# Patient Record
Sex: Male | Born: 1980
Health system: Southern US, Community
[De-identification: ages and names within clinical notes are randomized; demographics above are authoritative.]

## PROBLEM LIST (undated history)

## (undated) DIAGNOSIS — I10 Essential (primary) hypertension: Secondary | ICD-10-CM

---
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
## 2001-06-11 ENCOUNTER — Emergency Department (HOSPITAL_COMMUNITY): Admission: EM | Admit: 2001-06-11 | Discharge: 2001-06-11 | Payer: Self-pay | Admitting: Emergency Medicine

## 2001-07-05 ENCOUNTER — Emergency Department (HOSPITAL_COMMUNITY): Admission: EM | Admit: 2001-07-05 | Discharge: 2001-07-05 | Payer: Self-pay

## 2003-12-28 ENCOUNTER — Emergency Department (HOSPITAL_COMMUNITY): Admission: EM | Admit: 2003-12-28 | Discharge: 2003-12-28 | Payer: Self-pay | Admitting: Emergency Medicine

## 2004-03-01 ENCOUNTER — Emergency Department (HOSPITAL_COMMUNITY): Admission: EM | Admit: 2004-03-01 | Discharge: 2004-03-01 | Payer: Self-pay | Admitting: Emergency Medicine

## 2004-10-05 ENCOUNTER — Emergency Department (HOSPITAL_COMMUNITY): Admission: EM | Admit: 2004-10-05 | Discharge: 2004-10-05 | Payer: Self-pay | Admitting: Emergency Medicine
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
## 2008-11-06 ENCOUNTER — Emergency Department (HOSPITAL_COMMUNITY): Admission: EM | Admit: 2008-11-06 | Discharge: 2008-11-06 | Payer: Self-pay | Admitting: Emergency Medicine

## 2009-02-01 ENCOUNTER — Emergency Department (HOSPITAL_COMMUNITY): Admission: EM | Admit: 2009-02-01 | Discharge: 2009-02-02 | Payer: Self-pay | Admitting: Emergency Medicine

## 2013-08-10 ENCOUNTER — Emergency Department (INDEPENDENT_AMBULATORY_CARE_PROVIDER_SITE_OTHER)
Admission: EM | Admit: 2013-08-10 | Discharge: 2013-08-10 | Disposition: A | Discharge status: Home / Self Care | Payer: Self-pay | Source: Home / Self Care | Attending: Family Medicine | Admitting: Family Medicine

## 2013-08-10 ENCOUNTER — Encounter (HOSPITAL_COMMUNITY): Payer: Self-pay | Admitting: Emergency Medicine

## 2013-08-10 DIAGNOSIS — L84 Corns and callosities: Secondary | ICD-10-CM

## 2013-08-10 NOTE — ED Provider Notes (Signed)
CSN: [id number]     Arrival date & time 08/10/13  [id number] History   First MD Initiated Contact with Patient 08/10/13 1006     Chief Complaint  Patient presents with  . Foot Pain   (Consider location/radiation/quality/duration/timing/severity/associated sxs/prior Treatment) Patient is a 33 y.o. male presenting with lower extremity pain. The history is provided by the patient.  Foot Pain This is a new problem. The current episode started more than 1 week ago (tender area on foot for 1 mo.). The problem has been gradually worsening.    History reviewed. No pertinent past medical history. History reviewed. No pertinent past surgical history. History reviewed. No pertinent family history. History  Substance Use Topics  . Smoking status: Current Every Day Smoker  . Smokeless tobacco: Not on file  . Alcohol Use: Yes    Review of Systems  Constitutional: Negative.   Skin: Positive for wound.    Allergies  Review of patient's allergies indicates no known allergies.  Home Medications  No current outpatient prescriptions on file. BP 140/102  Pulse 90  Temp(Src) 97.6 F (36.4 C) (Oral)  Resp 18  SpO2 98% Physical Exam  Nursing note and vitals reviewed. Constitutional: He is oriented to person, place, and time. He appears well-developed and well-nourished.  Musculoskeletal: He exhibits tenderness.       Feet:  Neurological: He is alert and oriented to person, place, and time.  Skin: Skin is warm and dry.    ED Course  Debridement Date/Time: 08/10/2013 10:49 AM Performed by: Linna HoffKINDL, Jazzmon Prindle D Authorized by: Bradd CanaryKINDL, Corbyn Steedman D Consent: Verbal consent obtained. Consent given by: patient Patient identity confirmed: arm band Local anesthesia used: no Patient sedated: no Patient tolerance: Patient tolerated the procedure well with no immediate complications. Comments: Callous debrided to core.   (including critical care time) Labs Review Labs Reviewed - No data to display Imaging  Review No results found.  EKG Interpretation    Date/Time:    Ventricular Rate:    PR Interval:    QRS Duration:   QT Interval:    QTC Calculation:   R Axis:     Text Interpretation:              MDM      Linna HoffJames D Hayze Gazda, MD 08/10/13 1052

## 2013-08-10 NOTE — Discharge Instructions (Signed)
Use corn pad as needed

## 2013-08-10 NOTE — ED Notes (Signed)
Pt  Reports  Pain  l  4 th  Toe      X  1  Month  Getting  Worse   denys  specefic     Injury         No  Bleeding no  Drainage         No known injury  -  Pt  stes  Is a  Student  Who  Does  Not stand  On his  Feet  continously

## 2013-09-02 ENCOUNTER — Ambulatory Visit: Payer: Self-pay | Attending: Internal Medicine

## 2013-10-09 ENCOUNTER — Ambulatory Visit: Payer: No Typology Code available for payment source | Attending: Internal Medicine | Admitting: Internal Medicine

## 2013-10-09 ENCOUNTER — Encounter (HOSPITAL_COMMUNITY): Payer: Self-pay | Admitting: Emergency Medicine

## 2013-10-09 ENCOUNTER — Emergency Department (INDEPENDENT_AMBULATORY_CARE_PROVIDER_SITE_OTHER)
Admission: EM | Admit: 2013-10-09 | Discharge: 2013-10-09 | Disposition: A | Discharge status: Home / Self Care | Payer: No Typology Code available for payment source | Source: Home / Self Care | Attending: Family Medicine | Admitting: Family Medicine

## 2013-10-09 VITALS — BP 147/97 | HR 92 | Temp 99.0°F | Resp 17 | Wt 254.0 lb

## 2013-10-09 DIAGNOSIS — I10 Essential (primary) hypertension: Secondary | ICD-10-CM

## 2013-10-09 DIAGNOSIS — L84 Corns and callosities: Secondary | ICD-10-CM

## 2013-10-09 DIAGNOSIS — Z0000 Encounter for general adult medical examination without abnormal findings: Secondary | ICD-10-CM

## 2013-10-09 HISTORY — DX: Essential (primary) hypertension: I10

## 2013-10-09 LAB — COMPLETE METABOLIC PANEL WITH GFR
ALT: 24 U/L (ref 0–53)
AST: 22 U/L (ref 0–37)
Albumin: 4.5 g/dL (ref 3.5–5.2)
Alkaline Phosphatase: 48 U/L (ref 39–117)
BUN: 12 mg/dL (ref 6–23)
CO2: 28 mEq/L (ref 19–32)
Calcium: 9.7 mg/dL (ref 8.4–10.5)
Chloride: 103 mEq/L (ref 96–112)
Creat: 1.12 mg/dL (ref 0.50–1.35)
GFR, Est African American: >89 mL/min
GFR, Est Non African American: 86 mL/min
Glucose, Bld: 92 mg/dL (ref 70–99)
Potassium: 4.7 mEq/L (ref 3.5–5.3)
Sodium: 140 mEq/L (ref 135–145)
Total Bilirubin: 0.3 mg/dL (ref 0.2–1.2)
Total Protein: 7.3 g/dL (ref 6.0–8.3)

## 2013-10-09 LAB — CBC
HCT: 48.2 % (ref 39.0–52.0)
Hemoglobin: 15.9 g/dL (ref 13.0–17.0)
MCH: 30.2 pg (ref 26.0–34.0)
MCHC: 33 g/dL (ref 30.0–36.0)
MCV: 91.5 fL (ref 78.0–100.0)
Platelets: 235 10*3/uL (ref 150–400)
RBC: 5.27 MIL/uL (ref 4.22–5.81)
RDW: 14.4 % (ref 11.5–15.5)
WBC: 6 10*3/uL (ref 4.0–10.5)

## 2013-10-09 LAB — TSH: TSH: 0.627 u[IU]/mL (ref 0.350–4.500)

## 2013-10-09 LAB — LIPID PANEL
CHOLESTEROL: 189 mg/dL (ref 0–200)
HDL: 49 mg/dL (ref >39)
LDL Cholesterol: 123 mg/dL — ABNORMAL HIGH (ref 0–99)
TRIGLYCERIDES: 86 mg/dL (ref <150)
Total CHOL/HDL Ratio: 3.9 Ratio
VLDL: 17 mg/dL (ref 0–40)

## 2013-10-09 MED ORDER — AMLODIPINE BESYLATE 5 MG PO TABS: 5.0000 mg | ORAL_TABLET | Freq: Every day | ORAL | Status: DC

## 2013-10-09 NOTE — Discharge Instructions (Signed)
Recommend treatment with Dr Margart SicklesScholl's corn removal disc and pads. Soak feet prior to starting therapy and every time you change the pad. When corn successfully removed always where pad in that area to prevent recurrence.   Corns and Calluses A thickening of the skin layer (usually over bony areas, such as toe joints) is known as a corn. Two types of corns exist: hard corns and soft corns. Calluses are painless areas of skin thickening that are caused by repeated pressure or irritation. Corns tend to affect toe joints and the skin between the toes; whereas, a callus can appear on any part of the body (especially the hands, feet, or knees).  SYMPTOMS   Corn:  Presence of a small (1/8 to 3/8 inch [3 to 10 mm in diameter]), painful bump on the side or over the joint of a toe.  Hard corns are more common on the outer portion of the little (fifth) toe at the joint.  Soft corns are more common between bony bumps (prominences), usually between the fourth and fifth toes or between the second and third toes.  Callus:  A rough, thickened area of skin that appears after repeated pressure or irritation. CAUSES  The purpose of corns and calluses is to protect an area of skin from injury caused by repeated irritation (rubbing or squeezing). The presence of pressure causes the skin cells to grow at a faster rate than the cells of unaffected areas. This leads to an overgrowth (corn or callus). As apposed to hard corns, soft corns tend to develop between toes, because there is more moisture. Soft corns are often the result of prolonged shoe wear, which leads to increased perspiration and moisture.  RISK INCREASES WITH:  Shoes that are too tight.  Occupations or sports that involve repetitive pressure on the hands (racquetball and baseball) or sudden stops on hard surfaces (track and tennis).  Sports that require the athlete to wear shoes, perspire, or wear clothing or protective gear that causes the production  of heat and friction. PREVENTION  Properly fitted shoes and equipment.  Modify activities to prevent constant pressure on specific areas of skin.  If possible, wear padding over areas of skin that are exposed to repeated pressure or irritation.  Keep the area between the toes dry (with powder or by removing shoes often).  Relieve shoe pressure by stretching the areas of the shoe that cause the pressure and or use ointments to soften leather shoes. PROGNOSIS  Corns and calluses typically subside if the activity that causes them is eliminated. Recovery may take up to 3 weeks. Recurrence is likely even with treatment if the cause is not removed.  RELATED COMPLICATIONS  If one overcompensates in an attempt to avoid pain, he or she may experience pain in other areas due to the changes in body movements (mechanics). TREATMENT  The best way to treat corns and calluses is to remove the source of pressure. Corn and callus pads may be helpful in reducing pressure on the affected skin. For soft corns, try to keep the affected area dry. If you cannot find shoes that fit properly, a shoe repair shop may be able to alter your shoes to reduce pressure. Occasionally a cushion for the bottom of the foot (metatarsal bar) worn within the shoe may relieve pressure on corns or calluses of the foot. For calluses, you may be able to peel or rub the thickened area with a pumice stone, sandstone, callus file, or with sandpaper to remove the  callus; wetting the affected area may make this process more effective. Do not cut the corn or callus with a razor or knife. If the corn or callus must be removed, then a medically trained person should perform the procedure. After peeling away the upper layers of a corn once or twice a day, it may be recommended to apply a non-prescription 5% to 10% salicylic ointment and cover the area with a bandage. It very uncommon to have the bony bumps (at toe joints) surgically  removed. MEDICATION   If pain medication is necessary, nonsteroidal anti-inflammatory medications, such as aspirin and ibuprofen, or other minor pain relievers, such as acetaminophen, are often recommended. Contact your caregiver immediately if any bleeding, stomach upset, or signs of an allergic reaction occur.  Topical salicylic ointments (5% to 10%) may be of benefit.  Prescription pain medications may be given by a caregiver. Use only as directed and only as much as you need.  Soak the foot for 20 minutes, twice a day, in a gallon of warm water. This may help to soften corns and calluses. Care should be taken to thoroughly dry the foot, especially between the toes, after soaking. SEEK MEDICAL CARE IF:   Symptoms get worse or do not improve in 2 weeks despite treatment.  Any signs of infection develop, including redness, swelling, increased pain or tenderness, or increased warmth around the corn or callus.  New, unexplained symptoms develop (drugs used in treatment may produce side effects). Document Released: 07/22/2005 Document Revised: 10/14/2011 Document Reviewed: 11/03/2008 [facility] Patient Information 2014 [place], [place].

## 2013-10-09 NOTE — Progress Notes (Unsigned)
Patient ID: Adrian Schwartz, male   DOB: 03/11/1981, 33 y.o.   MRN: [id number]   HPI: Adrian Springmory L Walz is a 33 y.o. male presenting on 10/09/2013 here for establishing care and a callus on his left foot which he had shaved at urgent care and has recurred. It is very painful.     past medical history - Colon cancer age [age] in father  No past surgical history on file.  Current Outpatient Prescriptions  Medication Sig Dispense Refill   No current facility-administered medications for this visit.    No Known Allergies  No family history on file.  History   Social History  . Marital Status: Single    Spouse Name: N/A    Number of Children: N/A  . Years of Education: N/A   Occupational History  . Not on file.   Social History Main Topics  . Smoking status: Current Every Day Smoker- 1 ppd  . Smokeless tobacco: Not on file  . Alcohol Use: Yes  . Drug Use: Not on file  . Sexual Activity: Not on file   Other Topics Concern  . Not on file   Social History Narrative  . No narrative on file    Review of Systems  Review of Systems  Constitutional: Negative for fever, chills, diaphoresis, activity change, appetite change and fatigue.  HENT: Negative for ear pain, nosebleeds, congestion, facial swelling, rhinorrhea, neck pain, neck stiffness and ear discharge.  Eyes: Negative for pain, discharge, redness, itching and visual disturbance.  Respiratory: Negative for cough, choking, chest tightness, shortness of breath, wheezing and stridor.  Cardiovascular: Negative for chest pain, palpitations and leg swelling.  Gastrointestinal: Negative for abdominal distention, vomiting, diarrhea or consitpation Genitourinary: Negative for dysuria, urgency, frequency, hematuria, flank pain, decreased urine volume, difficulty urinating and dyspareunia.  Musculoskeletal: Negative for back pain, joint swelling, arthralgias or gait problem.  Neurological: Negative for dizziness, tremors, seizures,  syncope, facial asymmetry, speech difficulty, weakness, light-headedness, numbness and headaches.  Hematological: Negative for adenopathy. Does not bruise/bleed easily.  Psychiatric/Behavioral: Negative for hallucinations, behavioral problems, confusion, dysphoric mood   Objective:  BP 147/97  Pulse 92  Temp(Src) 99 F (37.2 C)  Resp 17  Wt 254 lb (115.214 kg)  SpO2 100% Filed Weights   10/09/13 1217  Weight: 254 lb (115.214 kg)     Physical Exam  Constitutional: Appears well-developed and well-nourished. No distress. HENT: Normocephalic. External right and left ear normal. Oropharynx is clear and moist.  Eyes: Conjunctivae and EOM are normal. PERRLA, no scleral icterus.  Neck: Normal ROM. Neck supple. No JVD. No tracheal deviation. No thyromegaly.  CVS: RRR, S1/S2 +, no murmurs, no gallops, no carotid bruit.  Pulmonary: Effort and breath sounds normal, no stridor, rhonchi, wheezes, rales.  Abdominal: Soft. BS +,  no distension, tenderness, rebound or guarding.  Musculoskeletal: Normal range of motion. No edema and no tenderness.  Neuro: Alert. Normal reflexes, muscle tone coordination. No cranial nerve deficit. Skin: Skin is warm and dry. Callus on sole of left foot near third toe noted. Not diaphoretic. No erythema. No pallor.  Psychiatric: Normal mood and affect. Behavior, judgment, thought content normal.   No results found for this basename: WBC, HGB, HCT, MCV, PLT   No results found for this basename: CREATININE, BUN, NA, K, CL, CO2    No results found for this basename: HGBA1C   Lipid Panel  No results found for this basename: chol, trig, hdl, cholhdl, vldl, ldlcalc  There are no active problems to display for this patient.    Preventative Medicine:  No health maintenance topics applied.  No health maintenance topics applied.   LAB WORK: ordered Metabolic panel: CBC:  Vitamin D : Lipid Panel: TSH:     Assessment and plan: Routine history  and physical examination of adult - Plan: CBC, COMPLETE METABOLIC PANEL WITH GFR, TSH, Lipid panel, Vit D  25 hydroxy (rtn osteoporosis monitoring)  HTN (hypertension) -  Amlodipine 5 mg dialy  Pre-ulcerative corn or callous  - return to urgent care - pressure reliving issues discussed  Morbid obesity  - used to work out but cannot due to corn - very concerned about his weight   Return in about 3 months (around 01/09/2014).   The patient was given clear instructions to go to ER or return to medical center if symptoms don't improve, worsen or new problems develop. The patient verbalized understanding. The patient was told to call to get lab results if they haven't heard anything in the next week.     Calvert Cantor, MD

## 2013-10-09 NOTE — Progress Notes (Unsigned)
Patient here to establish care Has a callus to his left foot  Uses a cream for his eczema but does not know the name of the cream

## 2013-10-09 NOTE — ED Notes (Signed)
Patient reports recurrent foot pain secondary to a callus.  Seen in January for the same.  Was seen at [facility] this am and mentioned callus to physician there and instructed to come to [facility] for this complaint

## 2013-10-10 NOTE — ED Provider Notes (Signed)
CSN: [id number]     Arrival date & time 10/09/13  1257 History   First MD Initiated Contact with Patient 10/09/13 1513     Chief Complaint  Patient presents with  . Foot Pain    Patient is a 33 y.o. male presenting with lower extremity pain. The history is provided by the patient.  Foot Pain This is a recurrent problem. The current episode started more than 1 week ago. The problem occurs constantly. The problem has been gradually worsening. The symptoms are aggravated by walking and standing. Nothing relieves the symptoms. He has tried nothing for the symptoms.  Pt reports painful corn/callus to the bottom of his foot under his (L) 4th toe. States he was here in January and had the same callus debrided but it has returned. Pt reports it is painful to walk on. Pt states he does not want this debrided he wants another way to get rid of the callus.   Past Medical History  Diagnosis Date  . Hypertension    History reviewed. No pertinent past surgical history. No family history on file. History  Substance Use Topics  . Smoking status: Current Every Day Smoker  . Smokeless tobacco: Not on file  . Alcohol Use: Yes    Review of Systems  All other systems reviewed and are negative.    Allergies  Review of patient's allergies indicates no known allergies.  Home Medications   Current Outpatient Rx  Name  Route  Sig  Dispense  Refill  . amLODipine (NORVASC) 5 MG tablet   Oral   Take 1 tablet (5 mg total) by mouth daily.   90 tablet   3    BP 157/89  Pulse 90  Temp(Src) 98.4 F (36.9 C) (Oral)  Resp 18  SpO2 97% Physical Exam  Nursing note and vitals reviewed. Constitutional: He is oriented to person, place, and time. He appears well-developed and well-nourished.  HENT:  Head: Normocephalic and atraumatic.  Eyes: Conjunctivae are normal.  Cardiovascular: Normal rate.   Pulmonary/Chest: Effort normal.  Musculoskeletal:       Feet:  Neurological: He is alert and oriented  to person, place, and time.  Skin: Skin is warm and dry.  Psychiatric: He has a normal mood and affect.    ED Course  Procedures (including critical care time) Labs Review Labs Reviewed - No data to display Imaging Review No results found.   MDM   1. Corn of foot    At length discussion regarding treatment of corn/callus w/ Dr Margart SicklesScholl's medicated pads along with soaking feet etc. Discussed ways to avoid getting these again. Pt verbalizes understanding and agrees he would prefer to treat at home.     Leanne ChangKatherine P Shavonte Zhao, NP 10/10/13 2055

## 2013-10-11 LAB — VITAMIN D 25 HYDROXY (VIT D DEFICIENCY, FRACTURES): Vit D, 25-Hydroxy: 24 ng/mL — ABNORMAL LOW (ref 30–89)

## 2013-10-11 NOTE — ED Provider Notes (Signed)
Medical screening examination/treatment/procedure(s) were performed by resident physician or non-physician practitioner and as supervising physician I was immediately available for consultation/collaboration.   Zuzu Befort DOUGLAS MD.   Calais Svehla D Lieutenant Abarca, MD 10/11/13 2054 

## 2013-10-18 ENCOUNTER — Telehealth: Payer: Self-pay | Admitting: Internal Medicine

## 2013-10-18 NOTE — Telephone Encounter (Signed)
Pt has come in today to get the results of his bloodwork; please f/u with patient at [phone]

## 2013-10-20 NOTE — Telephone Encounter (Signed)
Left message on voicemail Patient last labs was 10/09/13

## 2013-10-21 MED ORDER — VITAMIN D (ERGOCALCIFEROL) 1.25 MG (50000 UNIT) PO CAPS: 50000.0000 [IU] | ORAL_CAPSULE | ORAL | Status: AC

## 2013-10-21 NOTE — Telephone Encounter (Signed)
Attempted to reach pt;nonworking number listed Script Vitamin D 50,000 units ordered with 4 refills

## 2013-10-21 NOTE — Progress Notes (Signed)
Quick Note:  Please let patient know that they are deficient in Vit D and prescribe 50,000 U Vit D weekly for 4 wks x 4 refills.  If they are unable to afford it, they can take Vit D2 OTC 2000 U daily. ______ 

## 2013-10-21 NOTE — Telephone Encounter (Signed)
Message copied by Darlis LoanSMITH, Tieasha Larsen D on Thu Oct 21, 2013  1:38 PM ------      Message from: Calvert CantorIZWAN, SAIMA      Created: Thu Oct 21, 2013  9:48 AM       Please let patient know that they are deficient in Vit D and prescribe 50,000 U Vit D weekly for 4 wks x 4 refills.        If they are unable to afford it, they can take Vit D2 OTC 2000 U daily. ------

## 2013-12-16 ENCOUNTER — Ambulatory Visit: Payer: No Typology Code available for payment source | Admitting: *Deleted

## 2013-12-16 NOTE — Progress Notes (Unsigned)
Patient reports having a callus on bottom of left and right foot. Patient has been seen at urgent care twice for callus on left foot. Patient reports he has been using Dr. Jari SportsmanScholls as advised by urgent but it is not working. Patient informed he will need a Podiatry referral. Patient given contact information for Dr. Daleen SquibbJames Crawford. Patient also reports a groin problem. Dr. Hyman HopesJegede did a visual exam. Patient informed he has skin tag that is getting bigger. Patient informed he will be referred to Dermatologist. Patient provided contact information to [facility] Derm[facility].  Patient verbalized understanding. Reather LaurenceJamie R Charels Stambaugh, RN

## 2017-12-24 ENCOUNTER — Encounter: Payer: Self-pay | Admitting: Podiatry

## 2017-12-30 NOTE — Progress Notes (Signed)
This encounter was created in error - please disregard.

## 2018-01-05 MED FILL — HYDROCODON-APAP 5-325: 5-325 | 3 days supply | Qty: 18 | Fill #0

## 2018-05-25 ENCOUNTER — Ambulatory Visit (HOSPITAL_COMMUNITY)
Admission: EM | Admit: 2018-05-25 | Discharge: 2018-05-25 | Disposition: A | Discharge status: Home / Self Care | Payer: Self-pay | Attending: Family Medicine | Admitting: Family Medicine

## 2018-05-25 ENCOUNTER — Encounter (HOSPITAL_COMMUNITY): Payer: Self-pay | Admitting: Emergency Medicine

## 2018-05-25 DIAGNOSIS — R202 Paresthesia of skin: Secondary | ICD-10-CM

## 2018-05-25 DIAGNOSIS — R200 Anesthesia of skin: Secondary | ICD-10-CM

## 2018-05-25 MED ORDER — METHYLPREDNISOLONE 4 MG PO TBPK: ORAL_TABLET | ORAL | 0 refills | Status: DC

## 2018-05-25 NOTE — Discharge Instructions (Signed)
Take the medrol pak as instructed Take all of day one today After the steroids take ibuprofen 800 mg 3 x a day with food as needed See a neurologist if symptoms persist or worsen

## 2018-05-25 NOTE — ED Triage Notes (Signed)
Pt c/o R hand numbness x1 week off and on, thought he was sleeping on his arm the wrong way but he slept on the other side and still c/o R arm numbness.

## 2018-05-25 NOTE — ED Provider Notes (Signed)
[facility]    CSN: [id number] Arrival date & time: 05/25/18  1005     History   Chief Complaint Chief Complaint  Patient presents with  . Arm Pain    HPI Adrian Schwartz is a 37 y.o. male.   HPI  Patient states he woke up with a numb hand a week ago.  He thinks because he was "sleeping wrong" on his right arm.  The numbness is more in his thumb index and long fingers.  He does not do repetitive motion at work.  His work is heavy.  He is never had any neuropathy or nerve pain.  He states he does drink alcohol.  Sometimes heavily.  He also sometimes takes over-the-counter sleep aids.  His strength is been normal.  Coordination is normal.  The numbness appears to wax and wane.  Is present during the day, as well as upon awakening each morning.  It does not feel like it is getting better.  No trauma.  No overuse.  Past Medical History:  Diagnosis Date  . Hypertension     There are no active problems to display for this patient.   History reviewed. No pertinent surgical history.     Home Medications    Prior to Admission medications   Medication Sig Start Date End Date Taking? Authorizing Provider  methylPREDNISolone (MEDROL DOSEPAK) 4 MG TBPK tablet tad 05/25/18   Eustace Moore, MD  Vitamin D, Ergocalciferol, (DRISDOL) 50000 UNITS CAPS capsule Take 1 capsule (50,000 Units total) by mouth every 7 (seven) days. 10/21/13   Calvert Cantor, MD    Family History No family history on file.  Social History Social History   Tobacco Use  . Smoking status: Current Every Day Smoker  Substance Use Topics  . Alcohol use: Yes  . Drug use: No     Allergies   Patient has no known allergies.   Review of Systems Review of Systems  Constitutional: Negative for chills and fever.  HENT: Negative for ear pain and sore throat.   Eyes: Negative for pain and visual disturbance.  Respiratory: Negative for cough and shortness of breath.   Cardiovascular: Negative  for chest pain and palpitations.  Gastrointestinal: Negative for abdominal pain and vomiting.  Genitourinary: Negative for dysuria and hematuria.  Musculoskeletal: Negative for arthralgias and back pain.  Skin: Negative for color change and rash.  Neurological: Positive for numbness. Negative for seizures and syncope.  All other systems reviewed and are negative.    Physical Exam Triage Vital Signs ED Triage Vitals  Enc Vitals Group     BP 05/25/18 1053 (!) 148/63     Pulse Rate 05/25/18 1053 69     Resp 05/25/18 1053 18     Temp 05/25/18 1053 98.1 F (36.7 C)     Temp src --      SpO2 05/25/18 1053 100 %     Weight --      Height --      Head Circumference --      Peak Flow --      Pain Score 05/25/18 1055 0     Pain Loc --      Pain Edu? --      Excl. in GC? --    No data found.  Updated Vital Signs BP (!) 148/63   Pulse 69   Temp 98.1 F (36.7 C)   Resp 18   SpO2 100%   Visual Acuity Right Eye Distance:  Left Eye Distance:   Bilateral Distance:    Right Eye Near:   Left Eye Near:    Bilateral Near:     Physical Exam  Constitutional: He appears well-developed and well-nourished. No distress.  HENT:  Head: Normocephalic and atraumatic.  Mouth/Throat: Oropharynx is clear and moist.  Eyes: Pupils are equal, round, and reactive to light. Conjunctivae are normal.  Neck: Normal range of motion.  Cardiovascular: Normal rate.  Pulmonary/Chest: Effort normal. No respiratory distress.  Abdominal: Soft. He exhibits no distension.  Musculoskeletal: Normal range of motion. He exhibits no edema, tenderness or deformity.  Neurological: He is alert. He displays normal reflexes. He exhibits normal muscle tone. Coordination normal.  Decreased sensation to light touch over index long and thumb.  Good range of motion.  Negative Phalen's and Tinel's.  Good range of motion of neck.  Negative Spurling's.  Skin: Skin is warm and dry.  Psychiatric: He has a normal mood and  affect. His behavior is normal.     UC Treatments / Results  Labs (all labs ordered are listed, but only abnormal results are displayed) Labs Reviewed - No data to display  EKG None  Radiology No results found.  Procedures Procedures (including critical care time)  Medications Ordered in UC Medications - No data to display  Initial Impression / Assessment and Plan / UC Course  I have reviewed the triage vital signs and the nursing notes.  Pertinent labs & imaging results that were available during my care of the patient were reviewed by me and considered in my medical decision making (see chart for details).     History is most consistent with carpal tunnel syndrome.  I cannot reproduce his discomfort with provocative testing.  We discussed other causes of neuropathy such as cervical, or polyneuropathy.  I discussed with him that sometimes you can be asleep, especially if sedated, and get a radial nerve palsy.  He does not have any weakness in his wrist extensors.  I am going to treat him with anti-inflammatories, prednisone pack.  When this is done he can take ibuprofen.  If his numbness persists he will need to see a neurologist. Final Clinical Impressions(s) / UC Diagnoses   Final diagnoses:  Numbness and tingling of right arm     Discharge Instructions     Take the medrol pak as instructed Take all of day one today After the steroids take ibuprofen 800 mg 3 x a day with food as needed See a neurologist if symptoms persist or worsen   ED Prescriptions    Medication Sig Dispense Auth. Provider   methylPREDNISolone (MEDROL DOSEPAK) 4 MG TBPK tablet tad 21 tablet Eustace Moore, MD     Controlled Substance Prescriptions [place] Controlled Substance Registry consulted? Not Applicable   Eustace Moore, MD 05/25/18 2039

## 2019-08-31 ENCOUNTER — Encounter (HOSPITAL_COMMUNITY): Payer: Self-pay | Admitting: Emergency Medicine

## 2019-08-31 ENCOUNTER — Other Ambulatory Visit: Payer: Self-pay

## 2019-08-31 ENCOUNTER — Ambulatory Visit (HOSPITAL_COMMUNITY)
Admission: EM | Admit: 2019-08-31 | Discharge: 2019-08-31 | Disposition: A | Discharge status: Home / Self Care | Payer: Self-pay | Attending: Physician Assistant | Admitting: Physician Assistant

## 2019-08-31 DIAGNOSIS — Y93B9 Activity, other involving muscle strengthening exercises: Secondary | ICD-10-CM

## 2019-08-31 DIAGNOSIS — S39012A Strain of muscle, fascia and tendon of lower back, initial encounter: Secondary | ICD-10-CM

## 2019-08-31 MED ORDER — CYCLOBENZAPRINE HCL 10 MG PO TABS: 10.0000 mg | ORAL_TABLET | Freq: Three times a day (TID) | ORAL | 0 refills | Status: DC | PRN

## 2019-08-31 NOTE — ED Triage Notes (Signed)
Pt here with lower back pain x 3 weeks after injuring at gym

## 2019-08-31 NOTE — ED Provider Notes (Signed)
[facility]    CSN: [id number] Arrival date & time: 08/31/19  6387      History   Chief Complaint Chief Complaint  Patient presents with  . Back Pain    HPI Adrian Schwartz is a 39 y.o. male.   Patient reports to urgent care today for Lower back pain after working out 3 weeks ago. He reports he was using a squat machine and noticed lower back pain after one of his sets. He did not notice a pop or a sudden pain. The pain started midline but has since become on the side of his spine. He reports the pain is mostly on the left side. He denies radiation of this pain down his legs. Denies numbness, weakness or tingling. No loss of bowel or bladder control. He has tried over the counter medication and had a professional massage, these helped some. He has rested more recently.   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      Past Medical History:  Diagnosis Date  . Hypertension     There are no problems to display for this patient.   History reviewed. No pertinent surgical history.     Home Medications    Prior to Admission medications   Medication Sig Start Date End Date Taking? Authorizing Provider  cyclobenzaprine (FLEXERIL) 10 MG tablet Take 1 tablet (10 mg total) by mouth 3 (three) times daily as needed for muscle spasms. 08/31/19   Orvin Netter, Marguerita Beards, PA-C  methylPREDNISolone (MEDROL DOSEPAK) 4 MG TBPK tablet tad Patient not taking: Reported on 08/31/2019 05/25/18   Raylene Everts, MD  Vitamin D, Ergocalciferol, (DRISDOL) 50000 UNITS CAPS capsule Take 1 capsule (50,000 Units total) by mouth every 7 (seven) days. 10/21/13   Debbe Odea, MD    Family History Family History  Problem Relation Age of Onset  . Hypertension Mother   . Cancer Father     Social History Social History   Tobacco Use  . Smoking status: Current Every Day Smoker  . Smokeless tobacco: Never Used  Substance Use Topics  . Alcohol use: Yes  . Drug use: No      Allergies   Patient has no known allergies.   Review of Systems Review of Systems  Constitutional: Negative for chills and fever.  Musculoskeletal: Positive for back pain. Negative for arthralgias, gait problem, joint swelling, myalgias, neck pain and neck stiffness.  Skin: Negative for color change and rash.  Neurological: Negative for weakness and numbness.     Physical Exam Triage Vital Signs ED Triage Vitals  Enc Vitals Group     BP 08/31/19 1021 (!) 146/97     Pulse Rate 08/31/19 1021 68     Resp 08/31/19 1021 18     Temp 08/31/19 1021 98.3 F (36.8 C)     Temp Source 08/31/19 1021 Oral     SpO2 08/31/19 1021 100 %     Weight --      Height --      Head Circumference --      Peak Flow --      Pain Score 08/31/19 1022 6     Pain Loc --      Pain Edu? --      Excl. in [place]? --    No data found.  Updated Vital Signs BP (!) 146/97 (BP Location: Right Arm)   Pulse 68   Temp 98.3 F (36.8 C) (Oral)  Resp 18   SpO2 100%   Visual Acuity Right Eye Distance:   Left Eye Distance:   Bilateral Distance:    Right Eye Near:   Left Eye Near:    Bilateral Near:     Physical Exam Vitals and nursing note reviewed.  Constitutional:      General: He is not in acute distress.    Appearance: Normal appearance. He is well-developed. He is not ill-appearing.  HENT:     Head: Normocephalic and atraumatic.  Eyes:     Conjunctiva/sclera: Conjunctivae normal.     Pupils: Pupils are equal, round, and reactive to light.  Cardiovascular:     Rate and Rhythm: Normal rate.  Pulmonary:     Effort: Pulmonary effort is normal. No respiratory distress.  Musculoskeletal:     Cervical back: Normal and neck supple.     Thoracic back: Normal.     Lumbar back: Spasms (left lumbar very tight and board like) and tenderness (left lumbar region) present. No bony tenderness. Normal range of motion. Negative right straight leg raise test and negative left straight leg raise test.      Comments: No midline tenderness  Skin:    General: Skin is warm and dry.  Neurological:     Mental Status: He is alert.      UC Treatments / Results  Labs (all labs ordered are listed, but only abnormal results are displayed) Labs Reviewed - No data to display  EKG   Radiology No results found.  Procedures Procedures (including critical care time)  Medications Ordered in UC Medications - No data to display  Initial Impression / Assessment and Plan / UC Course  I have reviewed the triage vital signs and the nursing notes.  Pertinent labs & imaging results that were available during my care of the patient were reviewed by me and considered in my medical decision making (see chart for details).     #Lumbar strain - no radiculopathy, no midline tenderness. SLR negative. Given history, believe to be a strain. Flexeril given, exercises given and encouraged movement and continued exercise as tolerated. Ibuprofen for pain   Final Clinical Impressions(s) / UC Diagnoses   Final diagnoses:  Strain of lumbar region, initial encounter     Discharge Instructions     Take 2 regular strength ibuprofen every 6 hours as needed for pain  Do not drive or drink alcohol with the flexeril  Use heat or ice as preferred  If you are not improving or have shooting pain down your legs or weakness , return for re-evaluation.  Use the stretches and exercises attached and continue to move.      ED Prescriptions    Medication Sig Dispense Auth. Provider   cyclobenzaprine (FLEXERIL) 10 MG tablet Take 1 tablet (10 mg total) by mouth 3 (three) times daily as needed for muscle spasms. 20 tablet Joan Herschberger, Veryl Speak, PA-C     PDMP not reviewed this encounter.   Hermelinda Medicus, PA-C 08/31/19 1203

## 2019-08-31 NOTE — Discharge Instructions (Addendum)
Take 2 regular strength ibuprofen every 6 hours as needed for pain  Do not drive or drink alcohol with the flexeril  Use heat or ice as preferred  If you are not improving or have shooting pain down your legs or weakness , return for re-evaluation.  Use the stretches and exercises attached and continue to move.

## 2020-04-22 ENCOUNTER — Ambulatory Visit (HOSPITAL_COMMUNITY)
Admission: EM | Admit: 2020-04-22 | Discharge: 2020-04-22 | Disposition: A | Discharge status: Home / Self Care | Payer: Self-pay | Attending: Family Medicine | Admitting: Family Medicine

## 2020-04-22 ENCOUNTER — Encounter (HOSPITAL_COMMUNITY): Payer: Self-pay | Admitting: Emergency Medicine

## 2020-04-22 ENCOUNTER — Other Ambulatory Visit: Payer: Self-pay

## 2020-04-22 DIAGNOSIS — M79672 Pain in left foot: Secondary | ICD-10-CM

## 2020-04-22 DIAGNOSIS — L03116 Cellulitis of left lower limb: Secondary | ICD-10-CM

## 2020-04-22 MED ORDER — PREDNISONE 10 MG (21) PO TBPK: ORAL_TABLET | Freq: Every day | ORAL | 0 refills | Status: AC

## 2020-04-22 MED ORDER — SULFAMETHOXAZOLE-TRIMETHOPRIM 800-160 MG PO TABS: 1.0000 | ORAL_TABLET | Freq: Two times a day (BID) | ORAL | 0 refills | Status: AC

## 2020-04-22 NOTE — ED Triage Notes (Signed)
left foot swelling.  No known injury. Patient noticed swelling and pain for 3 days.

## 2020-04-22 NOTE — ED Provider Notes (Signed)
[facility]   [id number] 04/22/20 Arrival Time: 1250  YI:FOYDX PAIN  SUBJECTIVE: History from: patient. Adrian Schwartz is a 39 y.o. male complains of left foot swelling and pain.  Reports that this has been going on for the last 3 days..  Denies a precipitating event or specific injury. Describes the pain as constant and achy in character. Has not tried OTC medications without relief. Symptoms are made worse with activity.  Denies history of gout.  Denies similar symptoms in the past.  Denies fever, chills, ecchymosis, weakness, numbness and tingling, saddle paresthesias, loss of bowel or bladder function.      ROS: As per HPI.  All other pertinent ROS negative.     Past Medical History:  Diagnosis Date  . Hypertension    History reviewed. No pertinent surgical history. No Known Allergies No current facility-administered medications on file prior to encounter.   Current Outpatient Medications on File Prior to Encounter  Medication Sig Dispense Refill  . cyclobenzaprine (FLEXERIL) 10 MG tablet Take 1 tablet (10 mg total) by mouth 3 (three) times daily as needed for muscle spasms. 20 tablet 0  . methylPREDNISolone (MEDROL DOSEPAK) 4 MG TBPK tablet tad (Patient not taking: Reported on 08/31/2019) 21 tablet 0  . Vitamin D, Ergocalciferol, (DRISDOL) 50000 UNITS CAPS capsule Take 1 capsule (50,000 Units total) by mouth every 7 (seven) days. 4 capsule 4   Social History   Socioeconomic History  . Marital status: Single    Spouse name: Not on file  . Number of children: Not on file  . Years of education: Not on file  . Highest education level: Not on file  Occupational History  . Not on file  Tobacco Use  . Smoking status: Current Every Day Smoker  . Smokeless tobacco: Never Used  Substance and Sexual Activity  . Alcohol use: Yes  . Drug use: No  . Sexual activity: Not on file  Other Topics Concern  . Not on file  Social History Narrative  . Not on file   Social  Determinants of Health   Financial Resource Strain:   . Difficulty of Paying Living Expenses: Not on file  Food Insecurity:   . Worried About Programme researcher, broadcasting/film/video in the Last Year: Not on file  . Ran Out of Food in the Last Year: Not on file  Transportation Needs:   . Lack of Transportation (Medical): Not on file  . Lack of Transportation (Non-Medical): Not on file  Physical Activity:   . Days of Exercise per Week: Not on file  . Minutes of Exercise per Session: Not on file  Stress:   . Feeling of Stress : Not on file  Social Connections:   . Frequency of Communication with Friends and Family: Not on file  . Frequency of Social Gatherings with Friends and Family: Not on file  . Attends Religious Services: Not on file  . Active Member of Clubs or Organizations: Not on file  . Attends Banker Meetings: Not on file  . Marital Status: Not on file  Intimate Partner Violence:   . Fear of Current or Ex-Partner: Not on file  . Emotionally Abused: Not on file  . Physically Abused: Not on file  . Sexually Abused: Not on file   Family History  Problem Relation Age of Onset  . Hypertension Mother   . Cancer Father     OBJECTIVE:  Vitals:   04/22/20 1548  BP: (!) 131/96  Pulse:  73  Resp: 18  Temp: 98.3 F (36.8 C)  TempSrc: Oral  SpO2: 100%    General appearance: ALERT; in no acute distress.  Head: NCAT Lungs: Normal respiratory effort CV:  pulses 2+ bilaterally. Cap refill < 2 seconds Musculoskeletal:  Inspection: Skin warm, dry, clear and intact without obvious erythema, effusion, or ecchymosis.  Palpation: Dorsal lateral aspect of the left foot is tender to palpation ROM: Limited ROM active and passive to left toes Skin: warm and dry, erythema, swelling, tenderness to dorsal surface of left foot.  There is an area of darker skin near the dorsal lateral aspect of the left foot, exam is consistent with cellulitis Neurologic: Ambulates without difficulty;  Sensation intact about the upper/ lower extremities Psychological: alert and cooperative; normal mood and affect  DIAGNOSTIC STUDIES:  No results found.   ASSESSMENT & PLAN:  1. Cellulitis of left lower extremity   2. Foot pain, left     Meds ordered this encounter  Medications  . predniSONE (STERAPRED UNI-PAK 21 TAB) 10 MG (21) TBPK tablet    Sig: Take by mouth daily for 6 days. Take 6 tablets on day 1, 5 tablets on day 2, 4 tablets on day 3, 3 tablets on day 4, 2 tablets on day 5, 1 tablet on day 6    Dispense:  21 tablet    Refill:  0    Order Specific Question:   Supervising Provider    Answer:   Merrilee Jansky [id number]  . sulfamethoxazole-trimethoprim (BACTRIM DS) 800-160 MG tablet    Sig: Take 1 tablet by mouth 2 (two) times daily for 7 days.    Dispense:  14 tablet    Refill:  0    Order Specific Question:   Supervising Provider    Answer:   Merrilee Jansky [id number]   Prescribed steroid taper Prescribed Bactrim Take as directed and to completion Follow-up with this office or with primary care symptoms are persisting Continue conservative management of rest, ice, and gentle stretches Take ibuprofen as needed for pain relief (may cause abdominal discomfort, ulcers, and GI bleeds avoid taking with other NSAIDs) Return or go to the ER if you have any new or worsening symptoms (fever, chills, chest pain, abdominal pain, changes in bowel or bladder habits, pain radiating into lower legs)   Reviewed expectations re: course of current medical issues. Questions answered. Outlined signs and symptoms indicating need for more acute intervention. Patient verbalized understanding. After Visit Summary given.       Moshe Cipro, NP 04/22/20 1705

## 2020-04-22 NOTE — Discharge Instructions (Signed)
I have sent in Bactrim for you to take twice daily for 7 days  I have sent in a prednisone taper for you to take for 6 days. 6 tablets on day one, 5 tablets on day two, 4 tablets on day three, 3 tablets on day four, 2 tablets on day five, and 1 tablet on day six.  Follow up with this office or with primary care as needed

## 2020-04-25 ENCOUNTER — Encounter (HOSPITAL_COMMUNITY): Payer: Self-pay

## 2020-04-25 ENCOUNTER — Ambulatory Visit (HOSPITAL_COMMUNITY)
Admission: EM | Admit: 2020-04-25 | Discharge: 2020-04-25 | Disposition: A | Discharge status: Home / Self Care | Payer: Self-pay | Attending: Internal Medicine | Admitting: Internal Medicine

## 2020-04-25 ENCOUNTER — Other Ambulatory Visit: Payer: Self-pay

## 2020-04-25 DIAGNOSIS — M79672 Pain in left foot: Secondary | ICD-10-CM

## 2020-04-25 NOTE — ED Provider Notes (Signed)
[facility]    CSN: [id number] Arrival date & time: 04/25/20  1407      History   Chief Complaint Chief Complaint  Patient presents with  . Foot Pain    HPI Adrian Schwartz is a 39 y.o. male was seen here 3 days ago for left foot pain.  Patient was managed as a case of cellulitis of the left foot and also managed for acute gout flare.  Patient has no history of gout.  No trauma to the foot.  Patient comes in saying that the pain is not improved.  No fever or chills.  Swelling is persistent.  Patient is requesting further evaluation.Marland Kitchen   HPI  Past Medical History:  Diagnosis Date  . Hypertension     There are no problems to display for this patient.   No past surgical history on file.     Home Medications    Prior to Admission medications   Medication Sig Start Date End Date Taking? Authorizing Provider  cyclobenzaprine (FLEXERIL) 10 MG tablet Take 1 tablet (10 mg total) by mouth 3 (three) times daily as needed for muscle spasms. 08/31/19   Darr, Veryl Speak, PA-C  predniSONE (STERAPRED UNI-PAK 21 TAB) 10 MG (21) TBPK tablet Take by mouth daily for 6 days. Take 6 tablets on day 1, 5 tablets on day 2, 4 tablets on day 3, 3 tablets on day 4, 2 tablets on day 5, 1 tablet on day 6 04/22/20 04/28/20  Moshe Cipro, NP  sulfamethoxazole-trimethoprim (BACTRIM DS) 800-160 MG tablet Take 1 tablet by mouth 2 (two) times daily for 7 days. 04/22/20 04/29/20  Moshe Cipro, NP  Vitamin D, Ergocalciferol, (DRISDOL) 50000 UNITS CAPS capsule Take 1 capsule (50,000 Units total) by mouth every 7 (seven) days. 10/21/13   Calvert Cantor, MD    Family History Family History  Problem Relation Age of Onset  . Hypertension Mother   . Cancer Father     Social History Social History   Tobacco Use  . Smoking status: Current Every Day Smoker  . Smokeless tobacco: Never Used  Substance Use Topics  . Alcohol use: Yes  . Drug use: No     Allergies   Patient has no known  allergies.   Review of Systems Review of Systems  Gastrointestinal: Negative.   Genitourinary: Negative.   Musculoskeletal: Positive for arthralgias and myalgias. Negative for back pain and joint swelling.  Skin: Negative.      Physical Exam Triage Vital Signs ED Triage Vitals  Enc Vitals Group     BP 04/25/20 1443 127/81     Pulse Rate 04/25/20 1443 78     Resp 04/25/20 1443 16     Temp 04/25/20 1443 98.4 F (36.9 C)     Temp Source 04/25/20 1443 Oral     SpO2 04/25/20 1443 99 %     Weight --      Height --      Head Circumference --      Peak Flow --      Pain Score 04/25/20 1458 10     Pain Loc --      Pain Edu? --      Excl. in [place]? --    No data found.  Updated Vital Signs BP 127/81 (BP Location: Left Arm)   Pulse 78   Temp 98.4 F (36.9 C) (Oral)   Resp 16   SpO2 99%   Visual Acuity Right Eye Distance:   Left Eye  Distance:   Bilateral Distance:    Right Eye Near:   Left Eye Near:    Bilateral Near:     Physical Exam Vitals and nursing note reviewed.  Constitutional:      General: He is not in acute distress.    Appearance: Normal appearance. He is not ill-appearing.  HENT:     Right Ear: Tympanic membrane normal.     Left Ear: Tympanic membrane normal.  Cardiovascular:     Rate and Rhythm: Normal rate and regular rhythm.     Pulses: Normal pulses.     Heart sounds: Normal heart sounds.  Pulmonary:     Effort: Pulmonary effort is normal.     Breath sounds: Normal breath sounds.  Musculoskeletal:     Comments: Left foot swelling with mild erythema over the left great toe.  Neurological:     Mental Status: He is alert.      UC Treatments / Results  Labs (all labs ordered are listed, but only abnormal results are displayed) Labs Reviewed - No data to display  EKG   Radiology No results found.  Procedures Procedures (including critical care time)  Medications Ordered in UC Medications - No data to display  Initial Impression /  Assessment and Plan / UC Course  I have reviewed the triage vital signs and the nursing notes.  Pertinent labs & imaging results that were available during my care of the patient were reviewed by me and considered in my medical decision making (see chart for details).     1.  Painful left foot swelling: Patient currently on Bactrim and a short course of steroids Patient is advised to go to [facility] to be [facility].  Patient verbalized understanding.  He was no forthcoming with given failure history and kept referring me to medical record rather than asking questions. He was [facility] to [facility]. Final Clinical Impressions(s) / UC Diagnoses   Final diagnoses:  Left foot pain   Discharge Instructions   None    ED Prescriptions    None     PDMP not reviewed this encounter.   Merrilee Jansky, MD 04/25/20 1730

## 2020-04-25 NOTE — ED Triage Notes (Signed)
Pt is here with left foot pain that started 6 days ago, pt was seen here 3 days ago & given meds but it has no given any relief. Pt states when baring weight on it its impossible and has a pain score of 10.

## 2021-06-14 ENCOUNTER — Ambulatory Visit (INDEPENDENT_AMBULATORY_CARE_PROVIDER_SITE_OTHER): Payer: Self-pay

## 2021-06-14 ENCOUNTER — Ambulatory Visit
Admission: EM | Admit: 2021-06-14 | Discharge: 2021-06-14 | Disposition: A | Discharge status: Home / Self Care | Payer: Self-pay | Attending: Internal Medicine | Admitting: Internal Medicine

## 2021-06-14 ENCOUNTER — Other Ambulatory Visit: Payer: Self-pay

## 2021-06-14 DIAGNOSIS — S6991XA Unspecified injury of right wrist, hand and finger(s), initial encounter: Secondary | ICD-10-CM

## 2021-06-14 DIAGNOSIS — M79644 Pain in right finger(s): Secondary | ICD-10-CM
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
## 2021-06-14 NOTE — Discharge Instructions (Signed)
Your x-rays are normal.  Please follow-up with orthopedist for further evaluation and management.
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
## 2021-06-26 ENCOUNTER — Ambulatory Visit: Payer: Self-pay | Admitting: Orthopaedic Surgery

## 2021-06-26 ENCOUNTER — Other Ambulatory Visit: Payer: Self-pay

## 2021-06-26 DIAGNOSIS — M79644 Pain in right finger(s): Secondary | ICD-10-CM

## 2021-06-26 MED ORDER — NAPROXEN 500 MG PO TABS: 500.0000 mg | ORAL_TABLET | Freq: Two times a day (BID) | ORAL | 3 refills | Status: DC

## 2021-06-26 MED ORDER — PREDNISONE 10 MG (21) PO TBPK: ORAL_TABLET | ORAL | 3 refills | Status: DC
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
## 2021-07-05 ENCOUNTER — Other Ambulatory Visit: Payer: Self-pay

## 2021-07-05 ENCOUNTER — Ambulatory Visit (INDEPENDENT_AMBULATORY_CARE_PROVIDER_SITE_OTHER): Payer: Self-pay | Admitting: Orthopedic Surgery

## 2021-07-05 ENCOUNTER — Encounter: Payer: Self-pay | Admitting: Orthopedic Surgery

## 2021-07-05 DIAGNOSIS — S63639A Sprain of interphalangeal joint of unspecified finger, initial encounter: Secondary | ICD-10-CM

## 2021-07-05 NOTE — Progress Notes (Signed)
Office Visit Note   Patient: Adrian Schwartz           Date of Birth: 08/02/1981           MRN: [id number] Visit Date: 07/05/2021              Requested by: No referring provider defined for this encounter. PCP: Patient, No Pcp Per (Inactive)   Assessment & Plan: Visit Diagnoses:  1. Sprain of proximal interphalangeal (PIP) joint of finger     Plan: Reviewed with patient the nature of PIP joint injuries.  Discussed that PIP injuries can remain swollen for up to several months after injury.  The joint may never look the same as the other joints.  Importantly, he has maintained ROM of this finger and has near full ROM of the ring finger PIP joint but lacks 5-10 deg from full extension.  He has no pain in the joint.  Discussed the use of Coban for edema control but cautioned against applying too tightly.  He will continue to move the finger and work on ROM.  I can see him as needed.   Follow-Up Instructions: No follow-ups on file.   Orders:  No orders of the defined types were placed in this encounter.  No orders of the defined types were placed in this encounter.     Procedures: No procedures performed   Clinical Data: No additional findings.   Subjective: Chief Complaint  Patient presents with   Right Ring Finger - Pain, Edema    This is a 40 yo RHD M who presents with right ring finger swelling and stiffness after an injury 4-6 weeks ago.  He was previously seen by Dr. Roda Shutters and given a prednisone taper.  He notes swelling of the ring finger PIP joint but without pain.  He is able to make a near complete fist. He denies pain or swelling elsewhere in the hand.    Review of Systems   Objective: Vital Signs: There were no vitals taken for this visit.  Physical Exam Constitutional:      Appearance: Normal appearance.  Cardiovascular:     Rate and Rhythm: Normal rate.     Pulses: Normal pulses.  Pulmonary:     Effort: Pulmonary effort is normal.  Skin:    General:  Skin is warm and dry.     Capillary Refill: Capillary refill takes less than 2 seconds.  Neurological:     Mental Status: He is alert.    Right Hand Exam   Tenderness  The patient is experiencing no tenderness.   Other  Erythema: absent Sensation: normal Pulse: present  Comments:  Moderate swelling of the ring finger PIP joint.  Near full ROM from 5-10 deg extension to 100 deg flexion.  No radial/ulnar instability or pain.      Specialty Comments:  No specialty comments available.  Imaging: Previous imaging of the right ring finger reviewed and interpreted by me.  They demonstrate concentric appearance of the PIP joint with no evidence of bony injury or instability.    PMFS History: Patient Active Problem List   Diagnosis Date Noted   Sprain of proximal interphalangeal (PIP) joint of finger 07/05/2021   Past Medical History:  Diagnosis Date   Hypertension     Family History  Problem Relation Age of Onset   Hypertension Mother    Cancer Father     History reviewed. No pertinent surgical history. Social History   Occupational History  Not on file  Tobacco Use   Smoking status: Every Day   Smokeless tobacco: Never  Substance and Sexual Activity   Alcohol use: Yes   Drug use: No   Sexual activity: Yes    Birth control/protection: None

## 2021-08-01 ENCOUNTER — Other Ambulatory Visit: Payer: Self-pay

## 2021-08-01 ENCOUNTER — Ambulatory Visit
Admission: EM | Admit: 2021-08-01 | Discharge: 2021-08-01 | Disposition: A | Discharge status: Home / Self Care | Payer: Self-pay | Attending: Physician Assistant | Admitting: Physician Assistant

## 2021-08-01 DIAGNOSIS — M545 Low back pain, unspecified: Secondary | ICD-10-CM

## 2021-08-01 MED ORDER — CYCLOBENZAPRINE HCL 10 MG PO TABS: 10.0000 mg | ORAL_TABLET | Freq: Two times a day (BID) | ORAL | 0 refills | Status: DC | PRN

## 2021-08-01 MED ORDER — PREDNISONE 20 MG PO TABS: 40.0000 mg | ORAL_TABLET | Freq: Every day | ORAL | 0 refills | Status: AC

## 2021-08-01 NOTE — ED Triage Notes (Signed)
Two days ago while doing "bend over rolls" in the gym, Pt reports a sudden onset of low back pain. Transitioning from sitting to standing aggravates sxs. Has been using heating pads and otc pain meds with temporary relief. Denies BLE pain and n/t.

## 2021-08-01 NOTE — ED Provider Notes (Signed)
[facility]    CSN: [id number] Arrival date & time: 08/01/21  0813      History   Chief Complaint Chief Complaint  Patient presents with   Back Pain    lumbar    HPI Adrian Schwartz is a 40 y.o. male.   Patient here today for evaluation of lower back injury that occurred 2 days ago when he was doing bent over rows at the gym.  He reports that he was not bending his knees as he should and when he was lifting weights he felt a pain in his low back.  He states that transitioning from sitting to standing aggravates symptoms.  He denies any numbness or tingling.  He has tried using heating pads and over-the-counter pain medication with some relief.  The history is provided by the patient.   Past Medical History:  Diagnosis Date   Hypertension     Patient Active Problem List   Diagnosis Date Noted   Sprain of proximal interphalangeal (PIP) joint of finger 07/05/2021    History reviewed. No pertinent surgical history.     Home Medications    Prior to Admission medications   Medication Sig Start Date End Date Taking? Authorizing Provider  cyclobenzaprine (FLEXERIL) 10 MG tablet Take 1 tablet (10 mg total) by mouth 2 (two) times daily as needed for muscle spasms. 08/01/21  Yes Tomi Bamberger, PA-C  predniSONE (DELTASONE) 20 MG tablet Take 2 tablets (40 mg total) by mouth daily with breakfast for 5 days. 08/01/21 08/06/21 Yes Tomi Bamberger, PA-C  naproxen (NAPROSYN) 500 MG tablet Take 1 tablet (500 mg total) by mouth 2 (two) times daily with a meal. 06/26/21   Tarry Kos, MD  Vitamin D, Ergocalciferol, (DRISDOL) 50000 UNITS CAPS capsule Take 1 capsule (50,000 Units total) by mouth every 7 (seven) days. 10/21/13   Calvert Cantor, MD    Family History Family History  Problem Relation Age of Onset   Hypertension Mother    Cancer Father     Social History Social History   Tobacco Use   Smoking status: Former    Types: Cigarettes   Smokeless tobacco:  Never  Substance Use Topics   Alcohol use: Yes   Drug use: No     Allergies   Patient has no known allergies.   Review of Systems Review of Systems  Constitutional:  Negative for chills and fever.  Eyes:  Negative for discharge and redness.  Respiratory:  Negative for shortness of breath.   Genitourinary:  Negative for dysuria.  Musculoskeletal:  Positive for back pain.  Skin:  Positive for color change and wound.  Neurological:  Negative for weakness and numbness.    Physical Exam Triage Vital Signs ED Triage Vitals  Enc Vitals Group     BP      Pulse      Resp      Temp      Temp src      SpO2      Weight      Height      Head Circumference      Peak Flow      Pain Score      Pain Loc      Pain Edu?      Excl. in GC?    No data found.  Updated Vital Signs BP 137/88 (BP Location: Left Arm)    Pulse 81    Temp 98.2 F (36.8 C) (Oral)  Resp 18    SpO2 96%      Physical Exam Vitals and nursing note reviewed.  Constitutional:      General: He is not in acute distress.    Appearance: Normal appearance. He is not ill-appearing.  HENT:     Head: Normocephalic and atraumatic.  Eyes:     Conjunctiva/sclera: Conjunctivae normal.  Cardiovascular:     Rate and Rhythm: Normal rate.  Pulmonary:     Effort: Pulmonary effort is normal.  Musculoskeletal:     Comments: No TTP to midline spine or bilateral low back  Neurological:     Mental Status: He is alert.  Psychiatric:        Mood and Affect: Mood normal.        Behavior: Behavior normal.        Thought Content: Thought content normal.     UC Treatments / Results  Labs (all labs ordered are listed, but only abnormal results are displayed) Labs Reviewed - No data to display  EKG   Radiology No results found.  Procedures Procedures (including critical care time)  Medications Ordered in UC Medications - No data to display  Initial Impression / Assessment and Plan / UC Course  I have  reviewed the triage vital signs and the nursing notes.  Pertinent labs & imaging results that were available during my care of the patient were reviewed by me and considered in my medical decision making (see chart for details).    Will treat with prednisone and flexeril in hopes to cover muscle strain. Recommended follow up if no improvement with treatment or if symptoms worsen in any way.   Final Clinical Impressions(s) / UC Diagnoses   Final diagnoses:  Acute midline low back pain without sciatica   Discharge Instructions   None    ED Prescriptions     Medication Sig Dispense Auth. Provider   predniSONE (DELTASONE) 20 MG tablet Take 2 tablets (40 mg total) by mouth daily with breakfast for 5 days. 10 tablet Erma Pinto F, PA-C   cyclobenzaprine (FLEXERIL) 10 MG tablet Take 1 tablet (10 mg total) by mouth 2 (two) times daily as needed for muscle spasms. 20 tablet Tomi Bamberger, PA-C      PDMP not reviewed this encounter.   Tomi Bamberger, PA-C 08/01/21 [phone]

## 2021-12-19 ENCOUNTER — Ambulatory Visit: Admission: EM | Admit: 2021-12-19 | Discharge: 2021-12-19 | Discharge status: Absent without leave | Payer: Self-pay

## 2021-12-20 ENCOUNTER — Ambulatory Visit
Admission: EM | Admit: 2021-12-20 | Discharge: 2021-12-20 | Disposition: A | Discharge status: Home / Self Care | Payer: Self-pay | Attending: Family Medicine | Admitting: Family Medicine

## 2021-12-20 VITALS — BP 147/90 | HR 78 | Temp 97.7°F | Resp 18

## 2021-12-20 DIAGNOSIS — K140 Glossitis: Secondary | ICD-10-CM | POA: Insufficient documentation

## 2021-12-20 MED ORDER — VALACYCLOVIR HCL 1 G PO TABS: 1000.0000 mg | ORAL_TABLET | Freq: Two times a day (BID) | ORAL | 0 refills | Status: AC

## 2021-12-20 NOTE — ED Provider Notes (Signed)
[facility]    CSN: [id number] Arrival date & time: 12/20/21  1144      History   Chief Complaint Chief Complaint  Patient presents with   tounge sore   Appointment    HPI Adrian Schwartz is a 41 y.o. male.   HPI Here for a sore on the tip of his tongue. First noted 5/12. Has not been getting progressively bigger. Is sore. No dc. No associated f/c/URI symptoms/cough.  No n/v.  He is a former smoker, quit about 2 yrs ago. Does still occ have a cigar.  Past Medical History:  Diagnosis Date   Hypertension     Patient Active Problem List   Diagnosis Date Noted   Sprain of proximal interphalangeal (PIP) joint of finger 07/05/2021    History reviewed. No pertinent surgical history.     Home Medications    Prior to Admission medications   Medication Sig Start Date End Date Taking? Authorizing Provider  valACYclovir (VALTREX) 1000 MG tablet Take 1 tablet (1,000 mg total) by mouth 2 (two) times daily for 2 doses. 12/20/21 12/21/21 Yes Mareon Robinette, Janace Aris, MD  Vitamin D, Ergocalciferol, (DRISDOL) 50000 UNITS CAPS capsule Take 1 capsule (50,000 Units total) by mouth every 7 (seven) days. 10/21/13   Calvert Cantor, MD    Family History Family History  Problem Relation Age of Onset   Hypertension Mother    Cancer Father     Social History Social History   Tobacco Use   Smoking status: Former    Types: Cigarettes   Smokeless tobacco: Never  Substance Use Topics   Alcohol use: Yes    Comment: 1-2 times a week   Drug use: No     Allergies   Patient has no known allergies.   Review of Systems Review of Systems   Physical Exam Triage Vital Signs ED Triage Vitals  Enc Vitals Group     BP 12/20/21 1212 (!) 147/90     Pulse Rate 12/20/21 1212 78     Resp 12/20/21 1212 18     Temp 12/20/21 1212 97.7 F (36.5 C)     Temp Source 12/20/21 1212 Oral     SpO2 12/20/21 1212 97 %     Weight --      Height --      Head Circumference --      Peak  Flow --      Pain Score 12/20/21 1211 2     Pain Loc --      Pain Edu? --      Excl. in GC? --    No data found.  Updated Vital Signs BP (!) 147/90   Pulse 78   Temp 97.7 F (36.5 C) (Oral)   Resp 18   SpO2 97%   Visual Acuity Right Eye Distance:   Left Eye Distance:   Bilateral Distance:    Right Eye Near:   Left Eye Near:    Bilateral Near:     Physical Exam Vitals reviewed.  Constitutional:      General: He is not in acute distress.    Appearance: He is not ill-appearing, toxic-appearing or diaphoretic.  HENT:     Mouth/Throat:     Comments: On the left tip of his tongue, there is a white round area about 2-3 mm diameter. No erythema. Edge slightly raised, with center punched out, but still all white.  Cardiovascular:     Rate and Rhythm: Normal rate and regular rhythm.  Heart sounds: No murmur heard. Pulmonary:     Effort: Pulmonary effort is normal.     Breath sounds: Normal breath sounds.  Neurological:     General: No focal deficit present.     Mental Status: He is oriented to person, place, and time.     UC Treatments / Results  Labs (all labs ordered are listed, but only abnormal results are displayed) Labs Reviewed  HSV CULTURE AND TYPING    EKG   Radiology No results found.  Procedures Procedures (including critical care time)  Medications Ordered in UC Medications - No data to display  Initial Impression / Assessment and Plan / UC Course  I have reviewed the triage vital signs and the nursing notes.  Pertinent labs & imaging results that were available during my care of the patient were reviewed by me and considered in my medical decision making (see chart for details).     Will swab for hsv, though this seems atypical for viral ulcer. Also atypical for aphthous ulcer, and is more c/w leukoplakia, though I don't think that is usually tender.   Contact info given for ENT. Discussed no definitive tx indicated at this point. If this  did turn out to be positive for HSV, then in the future I would have him be seen sooner after symptoms started. Final Clinical Impressions(s) / UC Diagnoses   Final diagnoses:  Tongue ulcer     Discharge Instructions      Staff will call you if the test is positive that we did today.   Please call ENT for an appointment.  Take valcyclovir 1000 mg--take 2 tabs together, and then repeat that dose is 12 hours. This is the complete treatment for oral fever blister/herpes infection. It is usually only recommended in the first 3 days of symptoms.         ED Prescriptions     Medication Sig Dispense Auth. Provider   valACYclovir (VALTREX) 1000 MG tablet Take 1 tablet (1,000 mg total) by mouth 2 (two) times daily for 2 doses. 2 tablet Marlinda Mike Janace Aris, MD      PDMP not reviewed this encounter.   Zenia Resides, MD 12/20/21 1242

## 2021-12-20 NOTE — ED Triage Notes (Signed)
Patient presents to Urgent Care with complaints of oral lesion on tongue since last Friday. Patient reports discomfort. Salt water rinses .

## 2021-12-20 NOTE — Discharge Instructions (Addendum)
Staff will call you if the test is positive that we did today.   Please call ENT for an appointment.  Take valcyclovir 1000 mg--take 2 tabs together, and then repeat that dose is 12 hours. This is the complete treatment for oral fever blister/herpes infection. It is usually only recommended in the first 3 days of symptoms.

## 2021-12-23 LAB — HSV CULTURE AND TYPING

## 2022-04-15 ENCOUNTER — Ambulatory Visit (INDEPENDENT_AMBULATORY_CARE_PROVIDER_SITE_OTHER): Payer: Self-pay

## 2022-04-15 ENCOUNTER — Ambulatory Visit
Admission: RE | Admit: 2022-04-15 | Discharge: 2022-04-15 | Disposition: A | Discharge status: Home / Self Care | Payer: Self-pay | Source: Ambulatory Visit | Attending: Physician Assistant | Admitting: Physician Assistant

## 2022-04-15 VITALS — BP 148/95 | HR 62 | Temp 98.0°F | Resp 16

## 2022-04-15 DIAGNOSIS — S2231XA Fracture of one rib, right side, initial encounter for closed fracture: Secondary | ICD-10-CM

## 2022-04-15 DIAGNOSIS — R0782 Intercostal pain: Secondary | ICD-10-CM

## 2022-04-15 DIAGNOSIS — S80812A Abrasion, left lower leg, initial encounter: Secondary | ICD-10-CM

## 2022-04-15 MED ORDER — PREDNISONE 20 MG PO TABS: 40.0000 mg | ORAL_TABLET | Freq: Every day | ORAL | 0 refills | Status: AC

## 2022-04-15 MED ORDER — CYCLOBENZAPRINE HCL 10 MG PO TABS: 10.0000 mg | ORAL_TABLET | Freq: Two times a day (BID) | ORAL | 0 refills | Status: AC | PRN
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
## 2022-04-15 NOTE — Discharge Instructions (Addendum)
Please follow up with primary care as soon as possible- you may need repeat xrays in 6-8 weeks if no gradual improvement. Return sooner for follow up with any worsening symptoms.

## 2023-05-07 ENCOUNTER — Ambulatory Visit
Admission: RE | Admit: 2023-05-07 | Discharge: 2023-05-07 | Disposition: A | Discharge status: Home / Self Care | Payer: Self-pay | Source: Ambulatory Visit | Attending: Internal Medicine | Admitting: Internal Medicine

## 2023-05-07 VITALS — BP 136/81 | HR 61 | Temp 98.1°F | Resp 18 | Ht 72.0 in | Wt 220.0 lb

## 2023-05-07 DIAGNOSIS — L84 Corns and callosities: Secondary | ICD-10-CM

## 2023-05-07 DIAGNOSIS — S65312A Laceration of deep palmar arch of left hand, initial encounter: Secondary | ICD-10-CM

## 2023-05-07 DIAGNOSIS — M2041 Other hammer toe(s) (acquired), right foot: Secondary | ICD-10-CM | POA: Insufficient documentation
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
## 2023-05-07 NOTE — Discharge Instructions (Signed)
Follow up with your podiatrist as scheduled Steri-strips will fall off on their own

## 2023-05-07 NOTE — ED Triage Notes (Signed)
"  I am also having a Right Foot Problem, Surgery 5 yrs ago, and the pain is back". "I hope to mention and talk about".
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
## 2023-10-06 ENCOUNTER — Encounter: Payer: Self-pay | Admitting: Emergency Medicine

## 2023-10-06 ENCOUNTER — Ambulatory Visit
Admission: EM | Admit: 2023-10-06 | Discharge: 2023-10-06 | Disposition: A | Discharge status: Home / Self Care | Payer: Self-pay | Attending: Family Medicine | Admitting: Family Medicine

## 2023-10-06 ENCOUNTER — Other Ambulatory Visit: Payer: Self-pay

## 2023-10-06 ENCOUNTER — Ambulatory Visit (INDEPENDENT_AMBULATORY_CARE_PROVIDER_SITE_OTHER): Payer: Self-pay

## 2023-10-06 DIAGNOSIS — S66911A Strain of unspecified muscle, fascia and tendon at wrist and hand level, right hand, initial encounter: Secondary | ICD-10-CM

## 2023-10-06 DIAGNOSIS — M25531 Pain in right wrist: Secondary | ICD-10-CM

## 2023-10-06 MED ORDER — NAPROXEN 500 MG PO TABS: 500.0000 mg | ORAL_TABLET | Freq: Two times a day (BID) | ORAL | 0 refills | Status: AC

## 2023-10-06 MED ORDER — KETOROLAC TROMETHAMINE 30 MG/ML IJ SOLN
30.0000 mg | Freq: Once | INTRAMUSCULAR | Status: AC
  Administered 2023-10-06: 30 mg via INTRAMUSCULAR

## 2023-10-06 NOTE — Discharge Instructions (Addendum)
 You were given a Toradol injection in clinic today. Do not take any over the counter NSAID's such as Advil, ibuprofen, Aleve, or naproxen for 24 hours. You may take tylenol if needed.  Start naproxen twice daily for 7 days tomorrow, 3/4.  Use the wrist brace to help stabilize and support the wrist.  Please follow-up with your PCP or [facility] if your symptoms do not improve.  Please go to the ER for any worsening symptoms.

## 2023-10-06 NOTE — ED Triage Notes (Signed)
 Patient states he has throbbing right wrist pain since Saturday. 9/10. No known injury. Patient has tried OTC ibuprofen with no relief.

## 2023-10-06 NOTE — ED Provider Notes (Signed)
 [facility]    CSN: [id number] Arrival date & time: 10/06/23  0911      History   Chief Complaint No chief complaint on file.   HPI Adrian Schwartz is a 43 y.o. male presents for wrist pain.  Patient reports Saturday morning he awoke with right wrist pain that has progressed since then.  He endorses swelling and pain with movement but denies any numbness/tingling or weakness.  Denies any injury such as fall but does state he was moving a bunch of items the day before symptoms began.  No history of right wrist fractures or surgeries in the past.  He has been taking ibuprofen intermittently with some improvement in pain.  No other concerns at this time.  HPI  Past Medical History:  Diagnosis Date   Hypertension     Patient Active Problem List   Diagnosis Date Noted   Hammertoe of right foot 05/07/2023   Sprain of proximal interphalangeal (PIP) joint of finger 07/05/2021   Pain in left foot 04/25/2020    History reviewed. No pertinent surgical history.     Home Medications    Prior to Admission medications   Medication Sig Start Date End Date Taking? Authorizing Provider  naproxen (NAPROSYN) 500 MG tablet Take 1 tablet (500 mg total) by mouth 2 (two) times daily for 7 days. 10/07/23 10/14/23 Yes Radford Pax, NP  amoxicillin-clavulanate (AUGMENTIN) 875-125 MG tablet Take 1 tablet by mouth 2 (two) times daily.    [provider]  colchicine 0.6 MG tablet Take 0.6 mg by mouth as directed.    [provider]  cyclobenzaprine (FLEXERIL) 10 MG tablet Take 1 tablet (10 mg total) by mouth 2 (two) times daily as needed for muscle spasms. 04/15/22   Tomi Bamberger, PA-C  doxycycline (VIBRAMYCIN) 100 MG capsule Take 100 mg by mouth 2 (two) times daily.    [provider]  HYDROcodone-acetaminophen (NORCO/VICODIN) 5-325 MG tablet Take 2 tablets by mouth every 6 (six) hours as needed for moderate pain or severe pain. 12/31/17   [provider]   sulfamethoxazole-trimethoprim (BACTRIM DS) 800-160 MG tablet Take 1 tablet by mouth 2 (two) times daily.    [provider]  terbinafine (LAMISIL) 250 MG tablet Take 250 mg by mouth daily. 12/24/17   [provider]  triamcinolone ointment (KENALOG) 0.1 % Apply 1 Application topically 2 (two) times daily. 02/20/23   [provider]  valACYclovir (VALTREX) 1000 MG tablet Take 1,000 mg by mouth daily. 03/18/23   [provider]  Vitamin D, Ergocalciferol, (DRISDOL) 50000 UNITS CAPS capsule Take 1 capsule (50,000 Units total) by mouth every 7 (seven) days. 10/21/13   Calvert Cantor, MD    Family History Family History  Problem Relation Age of Onset   Hypertension Mother    Cancer Father     Social History Social History   Tobacco Use   Smoking status: Former    Types: Cigarettes   Smokeless tobacco: Never  Vaping Use   Vaping status: Never Used  Substance Use Topics   Alcohol use: Yes    Comment: 1-2 times a week   Drug use: No     Allergies   Patient has no known allergies.   Review of Systems Review of Systems  Musculoskeletal:        Right wrist pain      Physical Exam Triage Vital Signs ED Triage Vitals  Encounter Vitals Group     BP 10/06/23 0919 Marland Kitchen)  130/91     Systolic BP Percentile --      Diastolic BP Percentile --      Pulse Rate 10/06/23 0919 69     Resp 10/06/23 0919 12     Temp 10/06/23 0919 97.6 F (36.4 C)     Temp Source 10/06/23 0919 Oral     SpO2 10/06/23 0919 96 %     Weight --      Height --      Head Circumference --      Peak Flow --      Pain Score 10/06/23 0918 9     Pain Loc --      Pain Education --      Exclude from Growth Chart --    No data found.  Updated Vital Signs BP (!) 130/91 (BP Location: Left Arm)   Pulse 69   Temp 97.6 F (36.4 C) (Oral)   Resp 12   SpO2 96%   Visual Acuity Right Eye Distance:   Left Eye Distance:   Bilateral Distance:    Right Eye Near:   Left Eye Near:     Bilateral Near:     Physical Exam Vitals and nursing note reviewed.  Constitutional:      General: He is not in acute distress.    Appearance: Normal appearance. He is not ill-appearing.  HENT:     Head: Normocephalic and atraumatic.  Eyes:     Pupils: Pupils are equal, round, and reactive to light.  Cardiovascular:     Rate and Rhythm: Normal rate.  Pulmonary:     Effort: Pulmonary effort is normal.  Musculoskeletal:     Right wrist: Swelling, tenderness, bony tenderness and snuff box tenderness present. No deformity, effusion, lacerations or crepitus. Decreased range of motion. Normal pulse.  Skin:    General: Skin is warm and dry.  Neurological:     General: No focal deficit present.     Mental Status: He is alert and oriented to person, place, and time.  Psychiatric:        Mood and Affect: Mood normal.        Behavior: Behavior normal.      UC Treatments / Results  Labs (all labs ordered are listed, but only abnormal results are displayed) Labs Reviewed - No data to display  EKG   Radiology No results found.  Procedures Procedures (including critical care time)  Medications Ordered in UC Medications  ketorolac (TORADOL) 30 MG/ML injection 30 mg (30 mg Intramuscular Given 10/06/23 1017)    Initial Impression / Assessment and Plan / UC Course  I have reviewed the triage vital signs and the nursing notes.  Pertinent labs & imaging results that were available during my care of the patient were reviewed by me and considered in my medical decision making (see chart for details).     Reviewed exam and symptoms with patient.  No red flags.  Wet read of x-ray without obvious fracture.  Will contact patient for any positive results based on radiology overread.  As he does have snuffbox tenderness will place in Velcro thumb spica and discussed possible strain.  He was given Toradol injection in clinic.  He was monitored for 10 minutes after injection with no reaction  noted and tolerated well.  He was instructed no NSAIDs for 24 hours and verbalized understanding.  Will start naproxen twice daily tomorrow, 3/4.  Instructed him to follow-up with his PCP or [facility] if his symptoms are  not improving.  Strict ER precautions reviewed and patient verbalized understanding. Final Clinical Impressions(s) / UC Diagnoses   Final diagnoses:  Right wrist pain  Strain of right wrist, initial encounter     Discharge Instructions      You were given a Toradol injection in clinic today. Do not take any over the counter NSAID's such as Advil, ibuprofen, Aleve, or naproxen for 24 hours. You may take tylenol if needed.  Start naproxen twice daily for 7 days tomorrow, 3/4.  Use the wrist brace to help stabilize and support the wrist.  Please follow-up with your PCP or [facility] if your symptoms do not improve.  Please go to the ER for any worsening symptoms.      ED Prescriptions     Medication Sig Dispense Auth. Provider   naproxen (NAPROSYN) 500 MG tablet Take 1 tablet (500 mg total) by mouth 2 (two) times daily for 7 days. 14 tablet Radford Pax, NP      PDMP not reviewed this encounter.   Radford Pax, NP 10/06/23 1017
